# Patient Record
Sex: Female | Born: 2000
Health system: Southern US, Community
[De-identification: ages and names within clinical notes are randomized; demographics above are authoritative.]

## PROBLEM LIST (undated history)

## (undated) DIAGNOSIS — Q6689 Other  specified congenital deformities of feet: Secondary | ICD-10-CM

## (undated) DIAGNOSIS — E139 Other specified diabetes mellitus without complications: Secondary | ICD-10-CM

## (undated) DIAGNOSIS — R51 Headache: Secondary | ICD-10-CM

## (undated) DIAGNOSIS — R519 Headache, unspecified: Secondary | ICD-10-CM

## (undated) HISTORY — PX: WISDOM TOOTH EXTRACTION: SHX21

## (undated) HISTORY — DX: Other specified diabetes mellitus without complications: E13.9

## (undated) HISTORY — PX: TYMPANOSTOMY TUBE PLACEMENT: SHX32

---
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
## 2019-12-24 ENCOUNTER — Telehealth: Payer: Self-pay | Admitting: Pediatrics

## 2019-12-24 NOTE — Telephone Encounter (Signed)
The patient is needing a new patient appointment before the end of July. Can I schedule her in your 01/08/2020 at 11 AM?

## 2019-12-25 NOTE — Telephone Encounter (Signed)
I have the patient scheduled for a new patient appointment 07/23 at 15. NPP sent 12/24/2021

## 2019-12-25 NOTE — Telephone Encounter (Signed)
ok 

## 2020-01-05 DIAGNOSIS — F410 Panic disorder [episodic paroxysmal anxiety] without agoraphobia: Secondary | ICD-10-CM | POA: Diagnosis not present

## 2020-01-05 DIAGNOSIS — F411 Generalized anxiety disorder: Secondary | ICD-10-CM | POA: Diagnosis not present

## 2020-01-08 ENCOUNTER — Ambulatory Visit (INDEPENDENT_AMBULATORY_CARE_PROVIDER_SITE_OTHER): Payer: BC Managed Care – PPO | Admitting: Family Medicine

## 2020-01-08 ENCOUNTER — Other Ambulatory Visit: Payer: Self-pay

## 2020-01-08 ENCOUNTER — Encounter: Payer: Self-pay | Admitting: Family Medicine

## 2020-01-08 VITALS — BP 100/78 | HR 84 | Temp 98.6°F | Ht 66.0 in | Wt 138.4 lb

## 2020-01-08 DIAGNOSIS — Z13220 Encounter for screening for lipoid disorders: Secondary | ICD-10-CM

## 2020-01-08 DIAGNOSIS — G43809 Other migraine, not intractable, without status migrainosus: Secondary | ICD-10-CM

## 2020-01-08 DIAGNOSIS — E139 Other specified diabetes mellitus without complications: Secondary | ICD-10-CM | POA: Diagnosis not present

## 2020-01-08 MED ORDER — ELETRIPTAN HYDROBROMIDE 40 MG PO TABS: 40.0000 mg | ORAL_TABLET | ORAL | 0 refills | Status: DC | PRN

## 2020-01-08 NOTE — Patient Instructions (Signed)
Riboflavin (B2) 400mg  daily

## 2020-01-08 NOTE — Progress Notes (Signed)
Kelly Bass DOB: November 19, 2000 Encounter date: 01/08/2020  This isa 19 y.o. female who presents to establish care. Chief Complaint  Patient presents with  . Establish Care    History of present illness:  Was previously at [facility] - didn't feel like she was well listened to.   Migraines have been a struggle for her. Has struggled with headaches since she was 4. Mother and aunt and grandmother have struggled with this too, but resolved after first period. Seems to have gotten worse after period started.   Gets migraines about 4 times/month. Has had daily headache for last few weeks. Doesn't like taking ibuprofen daily.   Has been on lexapro for anxiety which she feels helps with headache.   Migraine starts behind eye and she gets light sensitivity and sound sensitivity. Throbs badly and has to lay in dark room for rest of day. Ibuprofen helps, but tylenol doesn't. Takes 600mg . excedrin helps sometimes. Has only taken imigtrex a few times, no help so far. No bad side effects with imitrex. Does have nausea, hasn't thrown up with headache.   Normally headache is frontal.   Too much exercise will cause headache, not necessarily migraine. Nancy Fetter can also cause migraine.   Not necessarily around periods. Does note more headaches with weather changes.   Blood sugar was discovered to be high when she was 11. Went to [facility] and had evaluation and was dx with MODY2. Just checks sugars monthly to make sure it is normal. Usually 135-145 in morning.   Sometimes wakes with headache. 3/4 come in afternoon.   Past Medical History:  Diagnosis Date  . Headache    1-2 x week; not migraine, per mother  . MODY 2, uncomplicated, controlled ([place])   . 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 SUBTALAR JOINT ARTHROEREISIS Bilateral 10/27/2015   Procedure: EXCISION OF BILATERAL SUBTALAR COALITION (POSTERIOR FACET) LATERAL  APPROACH;  Surgeon: Wylene Simmer, MD;  Location: [place];  Service: Orthopedics;  Laterality: Bilateral;  . TYMPANOSTOMY TUBE PLACEMENT Bilateral   . WISDOM TOOTH EXTRACTION     No Known Allergies Current Meds  Medication Sig  . aspirin-acetaminophen-caffeine (EXCEDRIN MIGRAINE) 250-250-65 MG tablet Take by mouth every 6 (six) hours as needed for headache.  . escitalopram (LEXAPRO) 10 MG tablet Take 10 mg by mouth daily.   . [DISCONTINUED] acetaminophen (TYLENOL) 500 MG tablet Take 1,000 mg by mouth every 6 (six) hours as needed.  . [DISCONTINUED] SUMAtriptan (IMITREX) 50 MG tablet Take 50 mg by mouth as needed for migraine. May repeat in 2 hours if headache persists or recurs.   Social History   Tobacco Use  . Smoking status: Never Smoker  . Smokeless tobacco: Never Used  Substance Use Topics  . Alcohol use: No   Family History  Problem Relation Age of Onset  . Hypotension Mother   . Arrhythmia Mother   . Breast cancer Maternal Grandmother 60     Review of Systems  Constitutional: Negative for chills, fatigue and fever.  Respiratory: Negative for cough, chest tightness, shortness of breath and wheezing.   Cardiovascular: Negative for chest pain, palpitations and leg swelling.    Objective:  BP 100/78 (BP Location: Left Arm, Patient Position: Sitting, Cuff Size: Normal)   Pulse 84   Temp 98.6 F (37 C) (Oral)   Ht 5\' 6"  (1.676 m)   Wt 138 lb 6.4 oz (62.8 kg)  LMP 12/23/2019 (Exact Date)   BMI 22.34 kg/m   Weight: 138 lb 6.4 oz (62.8 kg)   BP Readings from Last 3 Encounters:  01/08/20 100/78  04/22/18 111/68  10/27/15 (!) 131/70 (98 %, Z = 1.98 /  64 %, Z = 0.35)*   *BP percentiles are based on the 2017 AAP Clinical Practice Guideline for girls   Wt Readings from Last 3 Encounters:  01/08/20 138 lb 6.4 oz (62.8 kg) (67 %, Z= 0.44)*  04/22/18 143 lb 9.6 oz (65.1 kg) (79 %, Z= 0.79)*  05/03/16 134 lb 6.4 oz (61 kg) (74 %, Z= 0.64)*   * Growth  percentiles are based on CDC (Girls, 2-20 Years) data.    Physical Exam Constitutional:      General: She is not in acute distress.    Appearance: She is well-developed. She is not diaphoretic.  HENT:     Head: Normocephalic and atraumatic.     Right Ear: External ear normal.     Left Ear: External ear normal.  Eyes:     Conjunctiva/sclera: Conjunctivae normal.     Pupils: Pupils are equal, round, and reactive to light.  Neck:     Thyroid: No thyromegaly.  Cardiovascular:     Rate and Rhythm: Normal rate and regular rhythm.     Heart sounds: Normal heart sounds. No murmur heard.  No friction rub. No gallop.   Pulmonary:     Effort: Pulmonary effort is normal. No respiratory distress.     Breath sounds: Normal breath sounds. No wheezing or rales.  Musculoskeletal:     Cervical back: Neck supple.  Lymphadenopathy:     Cervical: No cervical adenopathy.  Skin:    General: Skin is warm and dry.  Neurological:     Mental Status: She is alert and oriented to person, place, and time.     Cranial Nerves: No cranial nerve deficit.     Motor: No abnormal muscle tone.     Deep Tendon Reflexes: Reflexes normal.     Reflex Scores:      Tricep reflexes are 2+ on the right side and 2+ on the left side.      Bicep reflexes are 2+ on the right side and 2+ on the left side.      Brachioradialis reflexes are 2+ on the right side and 2+ on the left side.      Patellar reflexes are 2+ on the right side and 2+ on the left side. Psychiatric:        Behavior: Behavior normal.     Assessment/Plan:  1. MODY 2, uncomplicated, controlled ([place]) Has been well controlled.  We will continue to monitor. - Hemoglobin A1c; Future - Hemoglobin A1c  2. Other migraine without status migrainosus, not intractable Options for abortive treatment as well as preventative treatment today.  Imitrex did not help with migraine, so we will try Relpax and see if this gives better relief.  Going to have her keep a  migraine diary and look at migraine triggers to see if we can identify any specific causes for migraine.  We also discussed riboflavin as preventative. - CBC with Differential/Platelet; Future - Comprehensive metabolic panel; Future - TSH; Future - Vitamin B12; Future - VITAMIN D 25 Hydroxy (Vit-D Deficiency, Fractures); Future - Magnesium, RBC; Future - Magnesium, RBC - VITAMIN D 25 Hydroxy (Vit-D Deficiency, Fractures) - Vitamin B12 - TSH - Comprehensive metabolic panel - CBC with Differential/Platelet  3. Lipid screening - Lipid panel;  Future - Lipid panel  Return in about 1 month (around 02/08/2020) for Chronic condition visit.  Micheline Rough, MD

## 2020-01-12 LAB — COMPREHENSIVE METABOLIC PANEL
AG Ratio: 2.2 (calc) (ref 1.0–2.5)
ALT: 19 U/L (ref 5–32)
AST: 16 U/L (ref 12–32)
Albumin: 4.6 g/dL (ref 3.6–5.1)
Alkaline phosphatase (APISO): 32 U/L — ABNORMAL LOW (ref 36–128)
BUN/Creatinine Ratio: 10 (calc) (ref 6–22)
BUN: 6 mg/dL — ABNORMAL LOW (ref 7–20)
CO2: 28 mmol/L (ref 20–32)
Calcium: 9.4 mg/dL (ref 8.9–10.4)
Chloride: 101 mmol/L (ref 98–110)
Creat: 0.62 mg/dL (ref 0.50–1.00)
Globulin: 2.1 g/dL (calc) (ref 2.0–3.8)
Glucose, Bld: 116 mg/dL — ABNORMAL HIGH (ref 65–99)
Potassium: 4.1 mmol/L (ref 3.8–5.1)
Sodium: 136 mmol/L (ref 135–146)
Total Bilirubin: 0.6 mg/dL (ref 0.2–1.1)
Total Protein: 6.7 g/dL (ref 6.3–8.2)

## 2020-01-12 LAB — LIPID PANEL
Cholesterol: 137 mg/dL (ref <170)
HDL: 47 mg/dL (ref >45)
LDL Cholesterol (Calc): 75 mg/dL (calc) (ref <110)
Non-HDL Cholesterol (Calc): 90 mg/dL (calc) (ref <120)
Total CHOL/HDL Ratio: 2.9 (calc) (ref <5.0)
Triglycerides: 69 mg/dL (ref <90)

## 2020-01-12 LAB — CBC WITH DIFFERENTIAL/PLATELET
Absolute Monocytes: 578 cells/uL (ref 200–950)
Basophils Absolute: 50 cells/uL (ref 0–200)
Basophils Relative: 0.9 %
Eosinophils Absolute: 149 cells/uL (ref 15–500)
Eosinophils Relative: 2.7 %
HCT: 39.6 % (ref 35.0–45.0)
Hemoglobin: 13.5 g/dL (ref 11.7–15.5)
Lymphs Abs: 2195 cells/uL (ref 850–3900)
MCH: 30.5 pg (ref 27.0–33.0)
MCHC: 34.1 g/dL (ref 32.0–36.0)
MCV: 89.4 fL (ref 80.0–100.0)
MPV: 10.1 fL (ref 7.5–12.5)
Monocytes Relative: 10.5 %
Neutro Abs: 2530 cells/uL (ref 1500–7800)
Neutrophils Relative %: 46 %
Platelets: 294 10*3/uL (ref 140–400)
RBC: 4.43 10*6/uL (ref 3.80–5.10)
RDW: 11.8 % (ref 11.0–15.0)
Total Lymphocyte: 39.9 %
WBC: 5.5 10*3/uL (ref 3.8–10.8)

## 2020-01-12 LAB — VITAMIN D 25 HYDROXY (VIT D DEFICIENCY, FRACTURES): Vit D, 25-Hydroxy: 32 ng/mL (ref 30–100)

## 2020-01-12 LAB — MAGNESIUM, RBC: Magnesium RBC: 5.4 mg/dL (ref 4.0–6.4)

## 2020-01-12 LAB — TSH: TSH: 1.58 mIU/L

## 2020-01-12 LAB — HEMOGLOBIN A1C
Hgb A1c MFr Bld: 5.8 % of total Hgb — ABNORMAL HIGH (ref <5.7)
Mean Plasma Glucose: 120 (calc)
eAG (mmol/L): 6.6 (calc)

## 2020-01-12 LAB — VITAMIN B12: Vitamin B-12: 328 pg/mL (ref 200–1100)

## 2020-01-21 ENCOUNTER — Encounter: Payer: Self-pay | Admitting: Family Medicine

## 2020-01-25 ENCOUNTER — Other Ambulatory Visit: Payer: Self-pay | Admitting: Family Medicine

## 2020-01-25 MED ORDER — RIZATRIPTAN BENZOATE 10 MG PO TABS
10.0000 mg | ORAL_TABLET | ORAL | 2 refills | Status: DC | PRN
Start: 2020-01-25 — End: 2020-06-28

## 2020-02-10 ENCOUNTER — Encounter: Payer: Self-pay | Admitting: Family Medicine

## 2020-02-10 NOTE — Telephone Encounter (Signed)
Virtual ok

## 2020-02-15 ENCOUNTER — Encounter: Payer: Self-pay | Admitting: Family Medicine

## 2020-02-16 DIAGNOSIS — Z20828 Contact with and (suspected) exposure to other viral communicable diseases: Secondary | ICD-10-CM | POA: Diagnosis not present

## 2020-02-16 DIAGNOSIS — R112 Nausea with vomiting, unspecified: Secondary | ICD-10-CM | POA: Diagnosis not present

## 2020-02-16 DIAGNOSIS — F3341 Major depressive disorder, recurrent, in partial remission: Secondary | ICD-10-CM | POA: Diagnosis not present

## 2020-02-16 DIAGNOSIS — R232 Flushing: Secondary | ICD-10-CM | POA: Diagnosis not present

## 2020-02-16 DIAGNOSIS — R1084 Generalized abdominal pain: Secondary | ICD-10-CM | POA: Diagnosis not present

## 2020-02-19 ENCOUNTER — Encounter: Payer: Self-pay | Admitting: Family Medicine

## 2020-02-19 ENCOUNTER — Telehealth (INDEPENDENT_AMBULATORY_CARE_PROVIDER_SITE_OTHER): Payer: BC Managed Care – PPO | Admitting: Family Medicine

## 2020-02-19 DIAGNOSIS — R112 Nausea with vomiting, unspecified: Secondary | ICD-10-CM | POA: Diagnosis not present

## 2020-02-19 DIAGNOSIS — R5383 Other fatigue: Secondary | ICD-10-CM | POA: Diagnosis not present

## 2020-02-19 DIAGNOSIS — R232 Flushing: Secondary | ICD-10-CM

## 2020-02-19 NOTE — Telephone Encounter (Signed)
I changed the appt type and the pt was able to connect with the PCP.

## 2020-02-19 NOTE — Progress Notes (Signed)
Virtual Visit via Video Note  I connected with Kelly Bass  on 02/19/20 at  2:00 PM EDT by a video enabled telemedicine application and verified that I am speaking with the correct person using two identifiers.  Location patient: home Location provider: [place], [place] [address] Persons participating in the virtual visit: patient, provider  I discussed the limitations of evaluation and management by telemedicine and the availability of in person appointments. The patient expressed understanding and agreed to proceed.   Kelly Bass DOB: 03/16/2001 Encounter date: 02/19/2020  This is a 19 y.o. female who presents with No chief complaint on file.   History of present illness: Started lamotrigine 3 weeks ago as half of 25mg  and then increased to 25mg  2 weeks ago. Last Saturday had hot flash which has happened occasionally since middle school and was ok after ten minutes, but then on Monday at work at around 7 she started getting hot, chest/face red, sweating, nauseated. This happened 5 times on Monday. Threw up at least twice during the day.  Tuesday just had 1-2 hot flashes. No throwing up.  Was ok weds, thurs.  Had one today at about 7am. Started getting hot again, sweating, and threw up.   Has only thrown up with episodes a few times, but never had this many episodes back to back. No headache with episodes, no vision changes, not dizzy. Usually last around 30 minutes. Has noticed that all episodes occurred while sitting. Stopped coffee after Monday, this didn't make difference. Thinks diet has been the same.   Does get heart racing with episodes. Not hard to breathe. Does get abd pain (was getting in lower stomach). It is typical for her to get abdominal pain with episodes. Has had diarrhea with previous episodes, but none this week.   Does feel fatigued when this ends. Not much energy. Uncertain of blood sugar, blood pressure during  episodes.   No Known Allergies No outpatient medications have been marked as taking for the 02/19/20 encounter (Appointment) with Caren Macadam, MD.    Review of Systems  Constitutional: Negative for chills, fatigue and fever.  Respiratory: Negative for cough, chest tightness, shortness of breath and wheezing.   Cardiovascular: Positive for palpitations (with episodes). Negative for chest pain and leg swelling.  Gastrointestinal: Positive for nausea and vomiting (with episodes). Negative for abdominal pain.  Neurological: Positive for headaches (still with migraines; relpax works to abort these).    Objective:  There were no vitals taken for this visit.      BP Readings from Last 3 Encounters:  01/08/20 100/78  04/22/18 111/68  10/27/15 (!) 131/70 (98 %, Z = 1.98 /  64 %, Z = 0.35)*   *BP percentiles are based on the 2017 AAP Clinical Practice Guideline for girls   Wt Readings from Last 3 Encounters:  01/08/20 138 lb 6.4 oz (62.8 kg) (67 %, Z= 0.44)*  04/22/18 143 lb 9.6 oz (65.1 kg) (79 %, Z= 0.79)*  05/03/16 134 lb 6.4 oz (61 kg) (74 %, Z= 0.64)*   * Growth percentiles are based on CDC (Girls, 2-20 Years) data.    EXAM:  GENERAL: alert, oriented, appears well and in no acute distress  HEENT: atraumatic, conjunctiva clear, no obvious abnormalities on inspection of external nose and ears  NECK: normal movements of the head and neck  LUNGS: on inspection no signs of respiratory distress, breathing rate appears normal, no obvious gross SOB,  gasping or wheezing  CV: no obvious cyanosis  MS: moves all visible extremities without noticeable abnormality  PSYCH/NEURO: pleasant and cooperative, no obvious depression or anxiety, speech and thought processing grossly intact   Assessment/Plan  1. Hot flashes We will start with some additional bloodwork.  Hot flashes certainly could be secondary to starting lamotrigine.  We also discussed that her Lexapro could be a  source of increased hot flashes, but my concern is that she has been having these episodes for years, and I wonder if there is an underlying/systemic cause.  Start with some baseline endocrinology labs, and consider further evaluation pending this.  This could potentially be related to ongoing migraines as well.  Further evaluation pending lab results. - Metanephrines, plasma; Future - T4, free; Future - T3, free; Future  2. Non-intractable vomiting with nausea, unspecified vomiting type See above.  3. Fatigue, unspecified type - Cortisol; Future - PTH, Intact and Calcium; Future   Return for pending lab results.   I discussed the assessment and treatment plan with the patient. The patient was provided an opportunity to ask questions and all were answered. The patient agreed with the plan and demonstrated an understanding of the instructions.   The patient was advised to call back or seek an in-person evaluation if the symptoms worsen or if the condition fails to improve as anticipated.  I provided 20 minutes of non-face-to-face time during this encounter.   Micheline Rough, MD

## 2020-02-24 ENCOUNTER — Telehealth: Payer: Self-pay | Admitting: *Deleted

## 2020-02-24 NOTE — Telephone Encounter (Signed)
Left a detailed message at the pts cell number to call for a lab appt as below.

## 2020-02-24 NOTE — Telephone Encounter (Signed)
-----   Message from Caren Macadam, MD sent at 02/21/2020  8:09 AM EDT ----- Please set up lab visit for patient

## 2020-03-03 ENCOUNTER — Encounter: Payer: Self-pay | Admitting: Family Medicine

## 2020-03-06 ENCOUNTER — Encounter: Payer: Self-pay | Admitting: Family Medicine

## 2020-03-14 NOTE — Telephone Encounter (Signed)
Kelly Bass - can you print and mail her lab orders and let her know you have done this/include below info? Thanks!  Looks like [name] may be best place to get labwork done? [facility] locations look all closed in her area, [facility] is limited to well [facility] in [facility]. There is   *[facility] [address] [place] [address]. Phone [phone]  *[facility] [place]. Phone [phone]  Both locations are M-F hours.

## 2020-04-08 DIAGNOSIS — R232 Flushing: Secondary | ICD-10-CM | POA: Diagnosis not present

## 2020-04-08 DIAGNOSIS — R5383 Other fatigue: Secondary | ICD-10-CM | POA: Diagnosis not present

## 2020-04-14 ENCOUNTER — Encounter: Payer: Self-pay | Admitting: Family Medicine

## 2020-04-26 DIAGNOSIS — B9689 Other specified bacterial agents as the cause of diseases classified elsewhere: Secondary | ICD-10-CM | POA: Diagnosis not present

## 2020-04-26 DIAGNOSIS — L600 Ingrowing nail: Secondary | ICD-10-CM | POA: Diagnosis not present

## 2020-05-24 DIAGNOSIS — Z20822 Contact with and (suspected) exposure to covid-19: Secondary | ICD-10-CM | POA: Diagnosis not present

## 2020-06-26 ENCOUNTER — Encounter: Payer: Self-pay | Admitting: Family Medicine

## 2020-06-27 ENCOUNTER — Other Ambulatory Visit: Payer: Self-pay | Admitting: Family Medicine

## 2020-07-06 MED ORDER — ESCITALOPRAM OXALATE 5 MG PO TABS: 15.0000 mg | ORAL_TABLET | Freq: Every day | ORAL | 3 refills | Status: DC

## 2020-07-14 ENCOUNTER — Other Ambulatory Visit: Payer: Self-pay

## 2020-07-15 ENCOUNTER — Encounter: Payer: Self-pay | Admitting: Family Medicine

## 2020-07-15 ENCOUNTER — Ambulatory Visit (INDEPENDENT_AMBULATORY_CARE_PROVIDER_SITE_OTHER): Payer: BC Managed Care – PPO | Admitting: Family Medicine

## 2020-07-15 ENCOUNTER — Other Ambulatory Visit: Payer: Self-pay

## 2020-07-15 VITALS — BP 100/68 | HR 79 | Temp 97.8°F | Ht 66.0 in | Wt 145.7 lb

## 2020-07-15 DIAGNOSIS — D219 Benign neoplasm of connective and other soft tissue, unspecified: Secondary | ICD-10-CM | POA: Diagnosis not present

## 2020-07-15 DIAGNOSIS — R232 Flushing: Secondary | ICD-10-CM | POA: Diagnosis not present

## 2020-07-15 DIAGNOSIS — L989 Disorder of the skin and subcutaneous tissue, unspecified: Secondary | ICD-10-CM | POA: Diagnosis not present

## 2020-07-15 NOTE — Progress Notes (Signed)
Kelly Bass DOB: [birth year] Encounter date: 07/15/2020  This is a 20 y.o. female who presents with Chief Complaint  Patient presents with  . Cyst    Patient compliains of cysts noted on the left medial ankle x1 year, right thumb x4 months, no known injury    History of present illness: *noted spot on thumb about 4 months ago - was skin toned, but now protruding, larger. It is sore to the touch. No injury.   *cyst on ankle for a year and a half iner left ankle. Sore if she pushes on it, but not otherwise painful.   *hasn't had hot flashes in a month. Before that was very irregular. Migraines she has noted change with weather. maxalt takes out migraine completely in 30 minutes.   No Known Allergies Current Meds  Medication Sig  . aspirin-acetaminophen-caffeine (EXCEDRIN MIGRAINE) 250-250-65 MG tablet Take by mouth every 6 (six) hours as needed for headache.  . escitalopram (LEXAPRO) 5 MG tablet Take 3 tablets (15 mg total) by mouth daily.  . rizatriptan (MAXALT) 10 MG tablet TAKE 1 TABLET BY MOUTH AS NEEDED FOR MIGRAINE. MAY REPEAT IN 2 HOURS IF NEEDED    Review of Systems  Constitutional: Negative for chills, fatigue and fever.  Respiratory: Negative for cough, chest tightness, shortness of breath and wheezing.   Cardiovascular: Negative for chest pain, palpitations and leg swelling.  Skin:       See hpi     Objective:  BP 100/68 (BP Location: Left Arm, Patient Position: Sitting, Cuff Size: Normal)   Pulse 79   Temp 97.8 F (36.6 C) (Oral)   Ht 5\' 6"  (1.676 m)   Wt 145 lb 11.2 oz (66.1 kg)   LMP 06/28/2020 (Exact Date)   BMI 23.52 kg/m   Weight: 145 lb 11.2 oz (66.1 kg)   BP Readings from Last 3 Encounters:  07/15/20 100/68  01/08/20 100/78  04/22/18 111/68   Wt Readings from Last 3 Encounters:  07/15/20 145 lb 11.2 oz (66.1 kg)  01/08/20 138 lb 6.4 oz (62.8 kg) (67 %, Z= 0.44)*  04/22/18 143 lb 9.6 oz (65.1 kg) (79 %, Z= 0.79)*   * Growth percentiles are  based on CDC (Girls, 2-20 Years) data.    Physical Exam Constitutional:      General: She is not in acute distress.    Appearance: She is well-developed.  Cardiovascular:     Rate and Rhythm: Normal rate and regular rhythm.     Heart sounds: Normal heart sounds. No murmur heard. No friction rub.  Pulmonary:     Effort: Pulmonary effort is normal. No respiratory distress.     Breath sounds: Normal breath sounds. No wheezing or rales.  Musculoskeletal:     Right lower leg: No edema.     Left lower leg: No edema.  Skin:    Comments: Vascular papule right palm of hand approximately 3 mm in diameter.  Nontender.  5 cm papule left medial ankle fibromatous, light pink in color.  Neurological:     Mental Status: She is alert and oriented to person, place, and time.  Psychiatric:        Behavior: Behavior normal.     Assessment/Plan  1. Skin lesion I do think that skin lesion of the palm is benign, but it seems to be enlarging and is vascular, so will refer to dermatology for further evaluation. - Ambulatory referral to Dermatology  2. Fibroma This lesion is stable and appears benign, but  it is irritating the patient.  Since return to dermatology, will let them treat.  3. Hot flashes Seems to been improved in the last month, but has been recurrent since teen years.  She did not complete follow-up blood work that was ordered after previous blood work, so we will complete this when able. - T4, free; Future - T3, free; Future - TSH; Future - Cortisol; Future - PTH, Intact and Calcium; Future - Metanephrines, plasma; Future    Return for pending bloodwork.     Micheline Rough, MD

## 2020-08-08 ENCOUNTER — Encounter: Payer: Self-pay | Admitting: Family Medicine

## 2020-08-08 MED ORDER — ESCITALOPRAM OXALATE 5 MG PO TABS: 15.0000 mg | ORAL_TABLET | Freq: Every day | ORAL | 3 refills | Status: DC

## 2020-09-21 ENCOUNTER — Encounter: Payer: Self-pay | Admitting: Family Medicine

## 2020-09-21 DIAGNOSIS — E139 Other specified diabetes mellitus without complications: Secondary | ICD-10-CM | POA: Insufficient documentation

## 2020-11-13 ENCOUNTER — Encounter: Payer: Self-pay | Admitting: Family Medicine

## 2020-12-06 ENCOUNTER — Encounter: Payer: Self-pay | Admitting: Family Medicine

## 2020-12-08 NOTE — Telephone Encounter (Signed)
Could not leave message due to voicemail box being full.

## 2020-12-28 ENCOUNTER — Encounter: Payer: Self-pay | Admitting: Family Medicine

## 2021-01-16 ENCOUNTER — Ambulatory Visit: Payer: BC Managed Care – PPO | Admitting: Family Medicine

## 2021-01-20 ENCOUNTER — Encounter: Payer: Self-pay | Admitting: Family Medicine

## 2021-01-20 ENCOUNTER — Telehealth (INDEPENDENT_AMBULATORY_CARE_PROVIDER_SITE_OTHER): Payer: BC Managed Care – PPO | Admitting: Family Medicine

## 2021-01-20 DIAGNOSIS — Z13220 Encounter for screening for lipoid disorders: Secondary | ICD-10-CM | POA: Diagnosis not present

## 2021-01-20 DIAGNOSIS — E139 Other specified diabetes mellitus without complications: Secondary | ICD-10-CM | POA: Diagnosis not present

## 2021-01-20 DIAGNOSIS — R519 Headache, unspecified: Secondary | ICD-10-CM

## 2021-01-20 DIAGNOSIS — D649 Anemia, unspecified: Secondary | ICD-10-CM | POA: Diagnosis not present

## 2021-01-20 NOTE — Progress Notes (Signed)
Virtual Visit via Video Note  I connected with Kelly Bass  on 01/20/21 at 11:30 AM EDT by a video enabled telemedicine application and verified that I am speaking with the correct person using two identifiers.  Location patient: home Location provider: [place], [place] [address] Persons participating in the virtual visit: patient, provider  I discussed the limitations of evaluation and management by telemedicine and the availability of in person appointments. The patient expressed understanding and agreed to proceed.   Kelly Bass DOB: 2001-01-28 Encounter date: 01/20/2021  This is a 20 y.o. female who presents with Chief Complaint  Patient presents with   Headache    Patient complains of headaches for years, states Lexapro was given for headaches and tried Ibuprofen with no relief, states she has had 15 headaches in the past month    History of present illness:  Hasn't been good about actually tracking them, but she threw out some of older ibuprofen and noted that within one month she went through whole bottle of twenty something ibuprofen. They have bene manageable. Hasn't had migraines. Maybe one migraine in this time. Woke up with a lot of headaches. Some in afternoon/evening.   Gets 7-9 hours of sleep. Doesn't feel well rested in morning. This is off and on.   Took blood sugar other day which was ok, but hct was 26. Mother checked her own after and it was 37;   Migraines 1-3/month; not bad. Maxalt takes this away.   No Known Allergies Current Meds  Medication Sig   aspirin-acetaminophen-caffeine (EXCEDRIN MIGRAINE) 250-250-65 MG tablet Take by mouth every 6 (six) hours as needed for headache.   escitalopram (LEXAPRO) 5 MG tablet Take 3 tablets (15 mg total) by mouth daily. (Patient taking differently: Take 10 mg by mouth daily.)   ibuprofen (ADVIL) 200 MG tablet Take 400 mg by mouth as needed.   rizatriptan (MAXALT) 10 MG  tablet TAKE 1 TABLET BY MOUTH AS NEEDED FOR MIGRAINE. MAY REPEAT IN 2 HOURS IF NEEDED    Review of Systems  Constitutional:  Positive for fatigue. Negative for chills and fever.  Respiratory:  Negative for cough, chest tightness, shortness of breath and wheezing.   Cardiovascular:  Negative for chest pain, palpitations and leg swelling.  Neurological:  Positive for headaches. Negative for dizziness and light-headedness.  Psychiatric/Behavioral:  Negative for sleep disturbance.    Objective:  LMP 01/03/2021 (Exact Date)       BP Readings from Last 3 Encounters:  07/15/20 100/68  01/08/20 100/78  04/22/18 111/68   Wt Readings from Last 3 Encounters:  07/15/20 145 lb 11.2 oz (66.1 kg)  01/08/20 138 lb 6.4 oz (62.8 kg) (67 %, Z= 0.44)*  04/22/18 143 lb 9.6 oz (65.1 kg) (79 %, Z= 0.79)*   * Growth percentiles are based on CDC (Girls, 2-20 Years) data.    EXAM:  GENERAL: alert, oriented, appears well and in no acute distress  HEENT: atraumatic, conjunctiva clear, no obvious abnormalities on inspection of external nose and ears  NECK: normal movements of the head and neck  LUNGS: on inspection no signs of respiratory distress, breathing rate appears normal, no obvious gross SOB, gasping or wheezing  CV: no obvious cyanosis  MS: moves all visible extremities without noticeable abnormality  PSYCH/NEURO: pleasant and cooperative, no obvious depression or anxiety, speech and thought processing grossly intact   Assessment/Plan 1. Frequent headaches Work on hydration, keep getting sleep, riboflavin. Try  to avoid treatment of mild headaches to avoid rebound. Start with bloodwork; consider additional treatment/preventative pending results.  - Comprehensive metabolic panel; Future - TSH; Future - Vitamin B12; Future - VITAMIN D 25 Hydroxy (Vit-D Deficiency, Fractures); Future - Magnesium, RBC; Future  2. Anemia, unspecified type - CBC with Differential/Platelet; Future -  Ferritin; Future - Iron and TIBC; Future  3. MODY 2, uncomplicated, controlled ([place]) - Hemoglobin A1c; Future  4. Lipid screening - Lipid panel; Future   Return for for bloodwork.    I discussed the assessment and treatment plan with the patient. The patient was provided an opportunity to ask questions and all were answered. The patient agreed with the plan and demonstrated an understanding of the instructions.   The patient was advised to call back or seek an in-person evaluation if the symptoms worsen or if the condition fails to improve as anticipated.  I provided 20 minutes of non-face-to-face time during this encounter.   Micheline Rough, MD

## 2021-01-23 ENCOUNTER — Telehealth: Payer: Self-pay | Admitting: *Deleted

## 2021-01-23 NOTE — Telephone Encounter (Signed)
-----   Message from Caren Macadam, MD sent at 01/20/2021  1:02 PM EDT ----- Set up lab visit for her at her convenience please

## 2021-01-23 NOTE — Telephone Encounter (Signed)
Left a detailed message for the patient to call the office to schedule a fasting lab appt as below.

## 2021-02-15 ENCOUNTER — Telehealth: Payer: Self-pay | Admitting: *Deleted

## 2021-02-15 NOTE — Telephone Encounter (Signed)
[facility] faxed a note stating the patients insurance will only cover a maximum of 1 tablet per day at the '5mg'$  strength for Escitalopram and a prior auth is needed.  Prior auth for 3-'5mg'$  tablets (due to '15mg'$  tablet not being available) was sent to [email or web address]-KeyFD:[id number] pending further info.

## 2021-02-21 ENCOUNTER — Encounter: Payer: Self-pay | Admitting: Family Medicine

## 2021-02-21 NOTE — Telephone Encounter (Signed)
Note in [email or web address].[email or web address] states the request was approved on September 2- Effective from 02/15/2021 through 02/14/2022.  Message sent to patient via Mychart message with this info.

## 2021-02-22 MED ORDER — RIZATRIPTAN BENZOATE 10 MG PO TABS: ORAL_TABLET | ORAL | 0 refills | Status: DC

## 2021-04-10 ENCOUNTER — Encounter: Payer: Self-pay | Admitting: Family Medicine

## 2021-06-02 ENCOUNTER — Telehealth: Payer: Self-pay | Admitting: Family Medicine

## 2021-06-02 NOTE — Telephone Encounter (Signed)
Alexis with [employer] called in to verify patients information and to discuss a prior authorization for a medication for the patient.  Ubaldo Glassing could be contacted at [phone].  Please advise.

## 2021-07-04 ENCOUNTER — Telehealth: Payer: Self-pay | Admitting: Family Medicine

## 2021-07-04 MED ORDER — ESCITALOPRAM OXALATE 5 MG PO TABS: 15.0000 mg | ORAL_TABLET | Freq: Every day | ORAL | 0 refills | Status: DC

## 2021-07-04 MED ORDER — RIZATRIPTAN BENZOATE 10 MG PO TABS: ORAL_TABLET | ORAL | 0 refills | Status: DC

## 2021-07-04 NOTE — Telephone Encounter (Signed)
Kelly Bass with [employer] called in wanting to refill rizatriptan (MAXALT) 10 MG tablet [[id number]] and escitalopram (LEXAPRO) 5 MG tablet [[id number]] or the patient.  Kelly Bass could be contacted at [phone].  Please advise.

## 2021-07-04 NOTE — Telephone Encounter (Signed)
Rx done. 

## 2021-07-11 ENCOUNTER — Telehealth: Payer: Self-pay | Admitting: Family Medicine

## 2021-07-11 NOTE — Telephone Encounter (Signed)
Pharmacy called to get clarification on rizatriptan (MAXALT) 10 MG tablet maximum daily dosage for patient for insurance.   Please send to  [place], [place] Phone:  [phone]  Fax:  [phone]         Please advise

## 2021-07-12 NOTE — Telephone Encounter (Signed)
Spoke with Armed forces technical officer with [employer] and informed her of the information below.

## 2021-07-12 NOTE — Telephone Encounter (Signed)
Pharmacy called again to follow up on maximum daily dose for rizatriptan (MAXALT) 10 MG tablet   Pam stated that there is no need for a new prescription they only need someone to call and let them know what it is.    Good callback number is [phone]   Please

## 2021-07-12 NOTE — Telephone Encounter (Signed)
No more that 2 tablets in 24 hours (max dose 20mg  daily). If I need to send over new rx let me know.

## 2021-07-12 NOTE — Telephone Encounter (Signed)
Please send new Rx to [facility] as below.

## 2021-08-29 ENCOUNTER — Other Ambulatory Visit: Payer: Self-pay | Admitting: Family Medicine

## 2021-08-29 MED ORDER — ESCITALOPRAM OXALATE 5 MG PO TABS: 15.0000 mg | ORAL_TABLET | Freq: Every day | ORAL | 0 refills | Status: DC

## 2021-08-29 MED ORDER — RIZATRIPTAN BENZOATE 10 MG PO TABS: ORAL_TABLET | ORAL | 0 refills | Status: AC

## 2021-09-01 ENCOUNTER — Telehealth: Payer: Self-pay | Admitting: Family Medicine

## 2021-09-01 NOTE — Telephone Encounter (Signed)
Debra with [employer] called in requesting information regarding rizatriptan (MAXALT) 10 MG tablet [[id number]]  medication. Hilda Blades wants to know max number of tabs in a 24 hour period since medication is taken as needed. ? ?Hilda Blades could be contacted at [phone]. ? ?Anyone who take the call could receive the information or if it's easier to send over a new prescription with the new information. ? ?Please advise. ?

## 2021-09-01 NOTE — Telephone Encounter (Signed)
Max number tabs 2 in 24 hours ?

## 2021-09-01 NOTE — Telephone Encounter (Signed)
Please advise 

## 2021-09-04 NOTE — Telephone Encounter (Signed)
Spoke with Delsa Sale and informed her of the instructions as below per PCP. ?

## 2021-11-03 ENCOUNTER — Encounter: Payer: BC Managed Care – PPO | Admitting: Family Medicine

## 2021-11-08 ENCOUNTER — Other Ambulatory Visit: Payer: Self-pay | Admitting: Family Medicine

## 2021-12-28 ENCOUNTER — Telehealth: Payer: Self-pay | Admitting: Family Medicine

## 2021-12-28 ENCOUNTER — Other Ambulatory Visit: Payer: Self-pay | Admitting: Family

## 2021-12-28 MED ORDER — ESCITALOPRAM OXALATE 5 MG PO TABS: ORAL_TABLET | ORAL | 0 refills | Status: AC

## 2021-12-28 NOTE — Telephone Encounter (Signed)
Pt's prescription for  escitalopram (LEXAPRO) 5 MG tablet cannot be delivered to a [address], requesting it be sent to  [place], [place] Phone:  [phone]  Fax:  [phone]

## 2022-01-19 ENCOUNTER — Other Ambulatory Visit: Payer: Self-pay | Admitting: Family

## 2022-02-01 DIAGNOSIS — D2372 Other benign neoplasm of skin of left lower limb, including hip: Secondary | ICD-10-CM | POA: Diagnosis not present

## 2022-02-01 DIAGNOSIS — D1801 Hemangioma of skin and subcutaneous tissue: Secondary | ICD-10-CM | POA: Diagnosis not present

## 2022-02-01 DIAGNOSIS — L858 Other specified epidermal thickening: Secondary | ICD-10-CM | POA: Diagnosis not present

## 2022-02-01 DIAGNOSIS — D2271 Melanocytic nevi of right lower limb, including hip: Secondary | ICD-10-CM | POA: Diagnosis not present

## 2022-03-16 DIAGNOSIS — Z1331 Encounter for screening for depression: Secondary | ICD-10-CM | POA: Diagnosis not present

## 2022-03-16 DIAGNOSIS — Z118 Encounter for screening for other infectious and parasitic diseases: Secondary | ICD-10-CM | POA: Diagnosis not present

## 2022-03-16 DIAGNOSIS — Z01419 Encounter for gynecological examination (general) (routine) without abnormal findings: Secondary | ICD-10-CM | POA: Diagnosis not present

## 2022-03-16 DIAGNOSIS — Z113 Encounter for screening for infections with a predominantly sexual mode of transmission: Secondary | ICD-10-CM | POA: Diagnosis not present

## 2022-03-16 DIAGNOSIS — Z119 Encounter for screening for infectious and parasitic diseases, unspecified: Secondary | ICD-10-CM | POA: Diagnosis not present

## 2022-03-26 ENCOUNTER — Other Ambulatory Visit: Payer: Self-pay | Admitting: *Deleted

## 2022-03-26 NOTE — Telephone Encounter (Signed)
Rx denial sent via escribe as the last visit was over 1 yr ago.

## 2022-07-17 DIAGNOSIS — F411 Generalized anxiety disorder: Secondary | ICD-10-CM | POA: Diagnosis not present

## 2022-08-02 DIAGNOSIS — F411 Generalized anxiety disorder: Secondary | ICD-10-CM | POA: Diagnosis not present

## 2023-06-05 DIAGNOSIS — F411 Generalized anxiety disorder: Secondary | ICD-10-CM | POA: Diagnosis not present
